# Patient Record
Sex: Female | Born: 1935 | Race: White | Hispanic: No | Marital: Married
Health system: Southern US, Community
[De-identification: ages and names within clinical notes are randomized; demographics above are authoritative.]

---

## 2004-07-28 ENCOUNTER — Inpatient Hospital Stay: Payer: Self-pay | Admitting: Cardiovascular Disease

## 2004-08-30 ENCOUNTER — Ambulatory Visit: Payer: Self-pay | Admitting: Internal Medicine

## 2004-09-18 ENCOUNTER — Ambulatory Visit: Payer: Self-pay | Admitting: Specialist

## 2004-09-19 ENCOUNTER — Ambulatory Visit: Payer: Self-pay | Admitting: Specialist

## 2004-11-15 ENCOUNTER — Ambulatory Visit: Payer: Self-pay | Admitting: Internal Medicine

## 2005-12-06 ENCOUNTER — Ambulatory Visit: Payer: Self-pay | Admitting: Internal Medicine

## 2006-01-21 ENCOUNTER — Ambulatory Visit: Payer: Self-pay | Admitting: Gastroenterology

## 2007-01-01 ENCOUNTER — Ambulatory Visit: Payer: Self-pay | Admitting: Internal Medicine

## 2007-02-12 ENCOUNTER — Ambulatory Visit: Payer: Self-pay

## 2007-07-27 ENCOUNTER — Ambulatory Visit: Payer: Self-pay | Admitting: Specialist

## 2007-07-30 ENCOUNTER — Inpatient Hospital Stay: Payer: Self-pay | Admitting: Specialist

## 2008-02-03 ENCOUNTER — Ambulatory Visit: Payer: Self-pay | Admitting: Internal Medicine

## 2009-02-22 ENCOUNTER — Ambulatory Visit: Payer: Self-pay | Admitting: Internal Medicine

## 2009-07-27 ENCOUNTER — Ambulatory Visit: Payer: Self-pay | Admitting: Internal Medicine

## 2010-05-18 ENCOUNTER — Ambulatory Visit: Payer: Self-pay | Admitting: Internal Medicine

## 2010-05-29 ENCOUNTER — Ambulatory Visit: Payer: Self-pay | Admitting: Internal Medicine

## 2010-06-13 ENCOUNTER — Ambulatory Visit: Payer: Self-pay | Admitting: Emergency Medicine

## 2010-06-22 ENCOUNTER — Ambulatory Visit: Payer: Self-pay | Admitting: Emergency Medicine

## 2010-06-28 ENCOUNTER — Ambulatory Visit: Payer: Self-pay | Admitting: Emergency Medicine

## 2010-06-29 ENCOUNTER — Ambulatory Visit: Payer: Self-pay | Admitting: Emergency Medicine

## 2010-07-05 LAB — PATHOLOGY REPORT

## 2010-07-10 ENCOUNTER — Ambulatory Visit: Payer: Self-pay | Admitting: Oncology

## 2010-07-22 ENCOUNTER — Ambulatory Visit: Payer: Self-pay | Admitting: Oncology

## 2010-07-31 ENCOUNTER — Ambulatory Visit: Payer: Self-pay | Admitting: Emergency Medicine

## 2010-08-22 ENCOUNTER — Ambulatory Visit: Payer: Self-pay | Admitting: Oncology

## 2010-09-20 ENCOUNTER — Ambulatory Visit: Payer: Self-pay | Admitting: Oncology

## 2010-11-12 ENCOUNTER — Ambulatory Visit: Payer: Self-pay | Admitting: Oncology

## 2010-11-13 LAB — CANCER ANTIGEN 27.29: CA 27.29: 22.3 U/mL (ref 0.0–38.6)

## 2010-11-20 ENCOUNTER — Ambulatory Visit: Payer: Self-pay | Admitting: Oncology

## 2010-12-21 ENCOUNTER — Ambulatory Visit: Payer: Self-pay | Admitting: Oncology

## 2010-12-31 ENCOUNTER — Inpatient Hospital Stay: Payer: Self-pay | Admitting: Vascular Surgery

## 2011-01-17 ENCOUNTER — Emergency Department: Payer: Self-pay | Admitting: Emergency Medicine

## 2011-01-20 ENCOUNTER — Ambulatory Visit: Payer: Self-pay | Admitting: Oncology

## 2011-02-13 LAB — CANCER ANTIGEN 27.29: CA 27.29: 16.4 U/mL (ref 0.0–38.6)

## 2011-02-20 ENCOUNTER — Ambulatory Visit: Payer: Self-pay | Admitting: Oncology

## 2011-03-20 ENCOUNTER — Ambulatory Visit: Payer: Self-pay | Admitting: Specialist

## 2011-03-23 ENCOUNTER — Ambulatory Visit: Payer: Self-pay | Admitting: Oncology

## 2011-04-02 ENCOUNTER — Ambulatory Visit: Payer: Self-pay | Admitting: Emergency Medicine

## 2011-04-03 LAB — PATHOLOGY REPORT

## 2011-05-30 ENCOUNTER — Ambulatory Visit: Payer: Self-pay | Admitting: Specialist

## 2011-07-01 IMAGING — US ULTRASOUND LEFT BREAST
1 series · 17 of 17 positions shown · non-contrast
Comparison: none

REASON FOR EXAM: LT NOD DENSITY
COMMENTS:

PROCEDURE:     US  - US BREAST LEFT  - May 29, 2010 [DATE]
RESULT:     An irregularly shadowing hypoechoic lesion is noted in the
region of mammographic abnormality in the outer aspect of the left breast.
This lesion is suspicious for malignancy and surgical evaluation is
suggested.

[Series 1: ultrasound left breast · 17 of 17 slices shown]
[im 1/17]
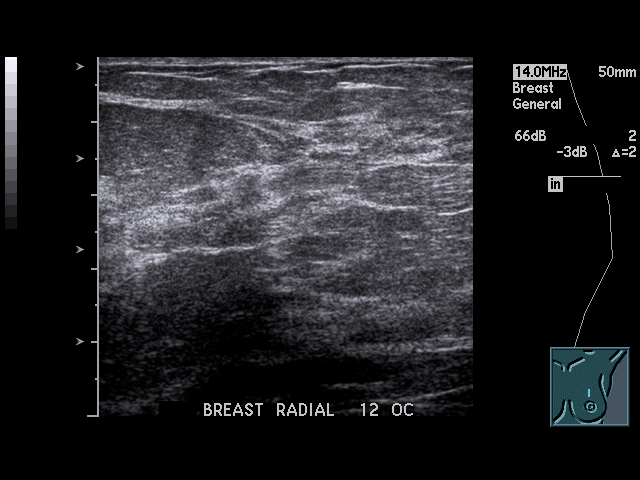
[im 2/17]
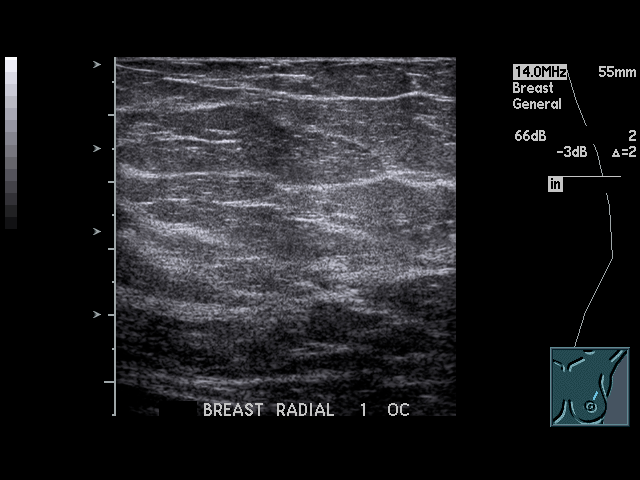
[im 3/17]
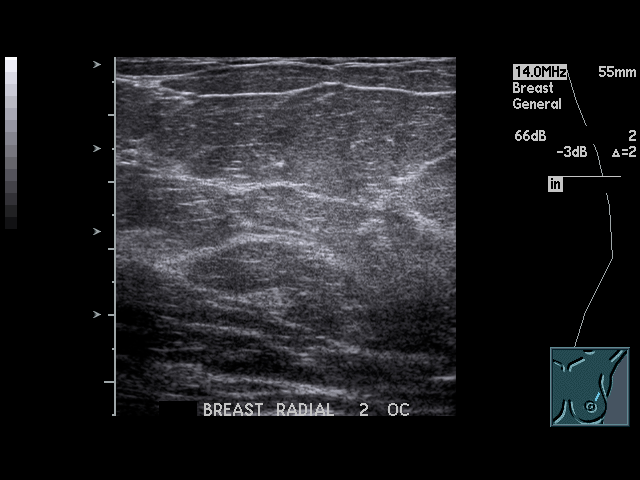
[im 4/17]
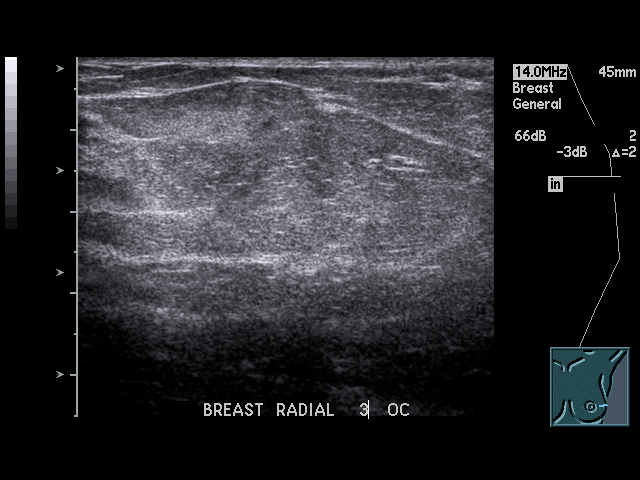
[im 5/17]
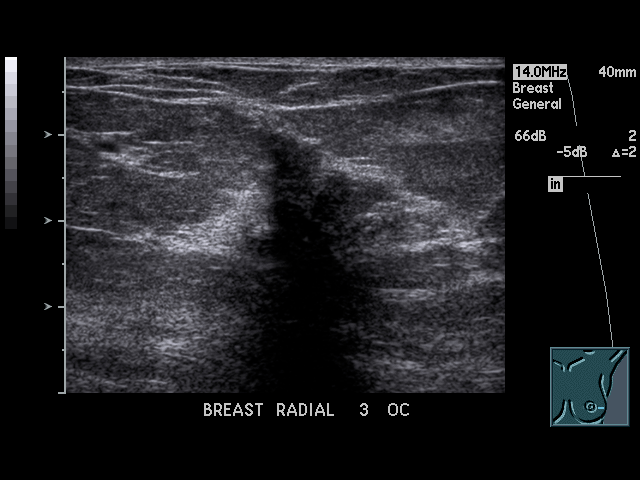
[im 6/17]
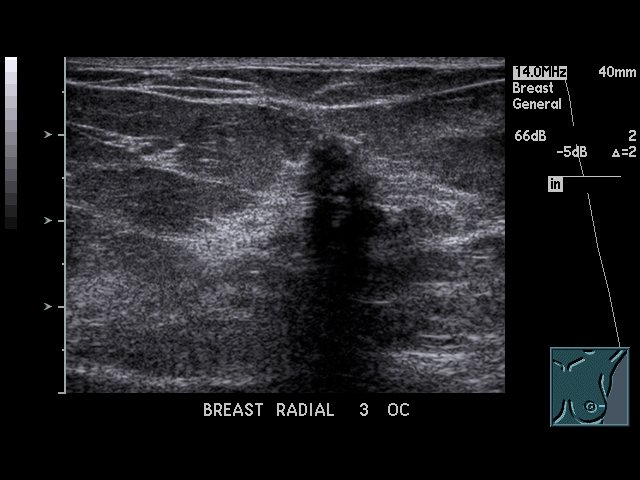
[im 7/17]
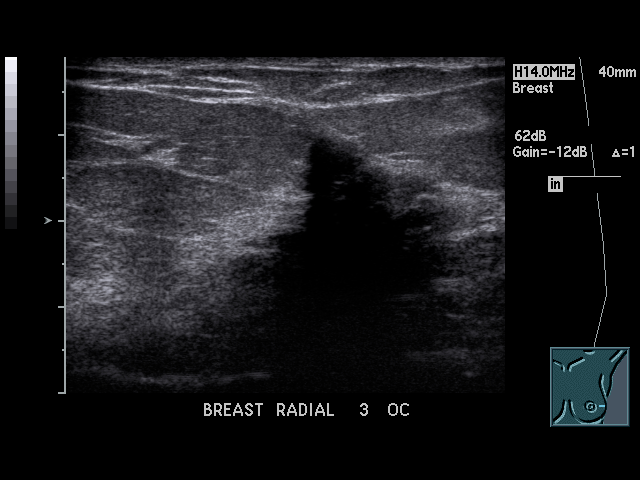
[im 8/17]
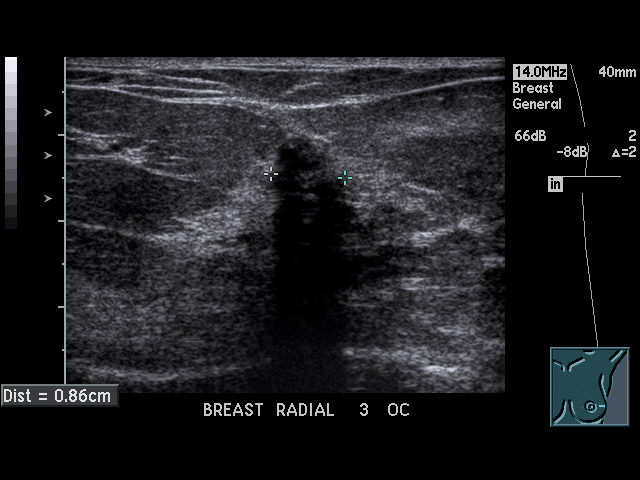
[im 9/17]
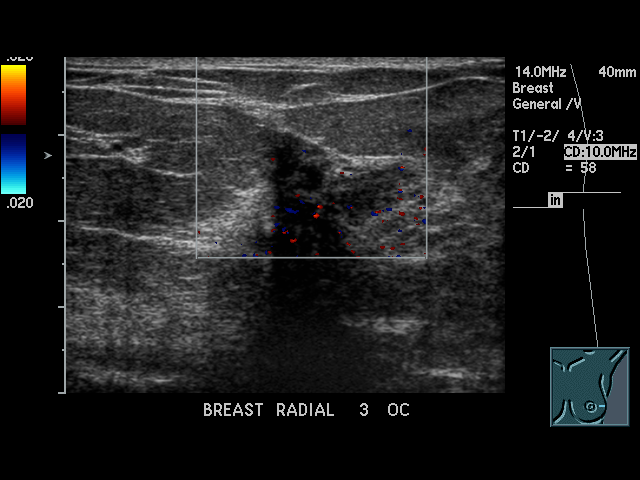
[im 10/17]
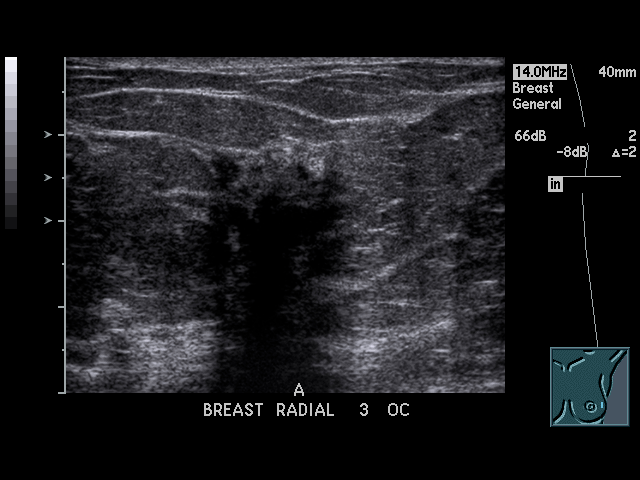
[im 11/17]
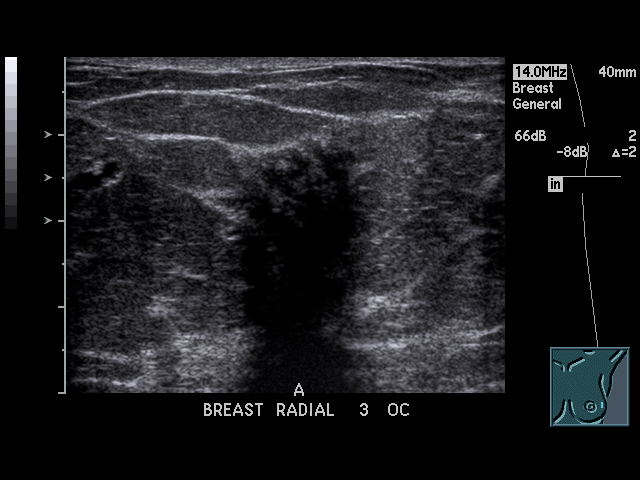
[im 12/17]
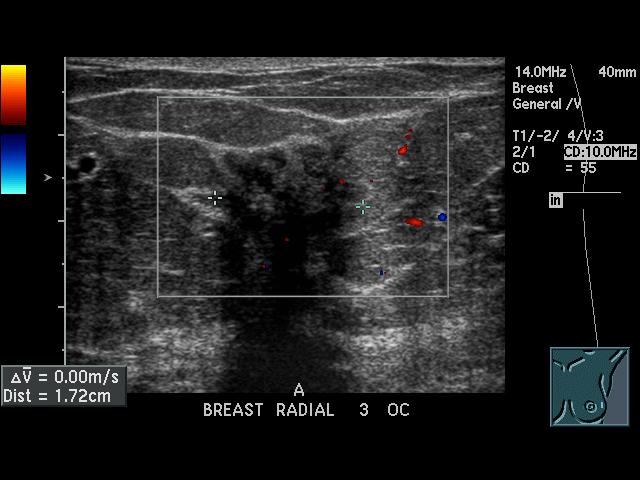
[im 13/17]
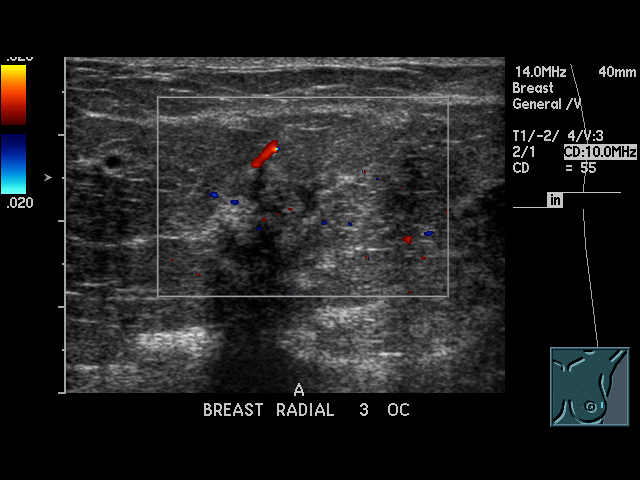
[im 14/17]
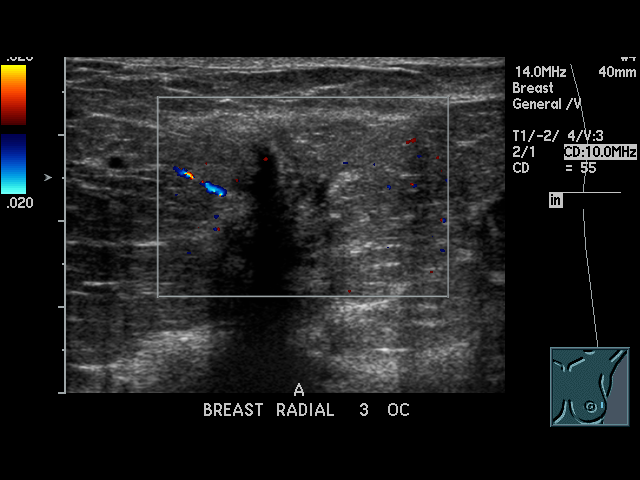
[im 15/17]
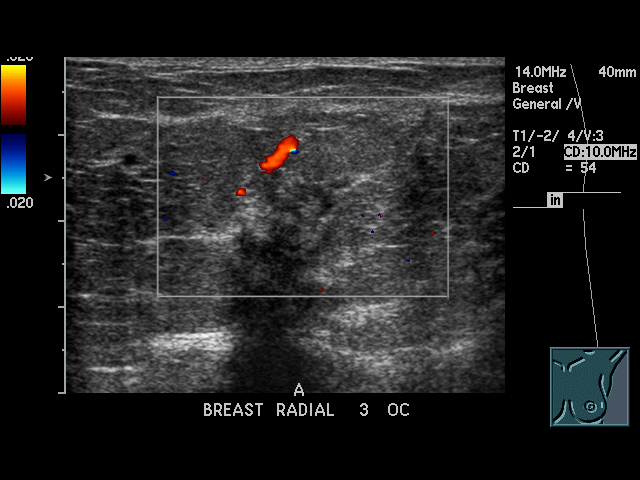
[im 16/17]
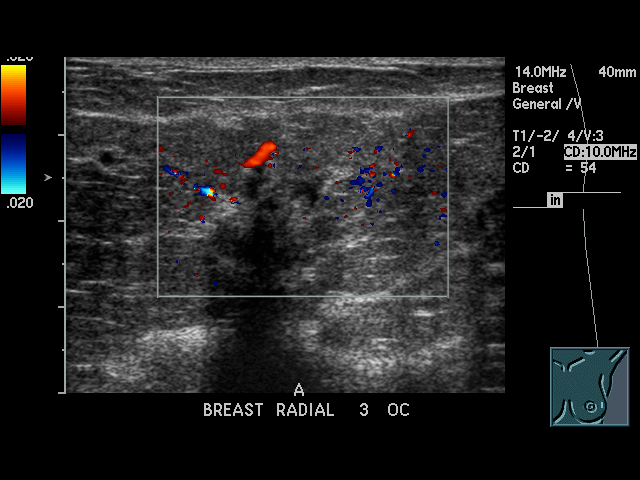
[im 17/17]
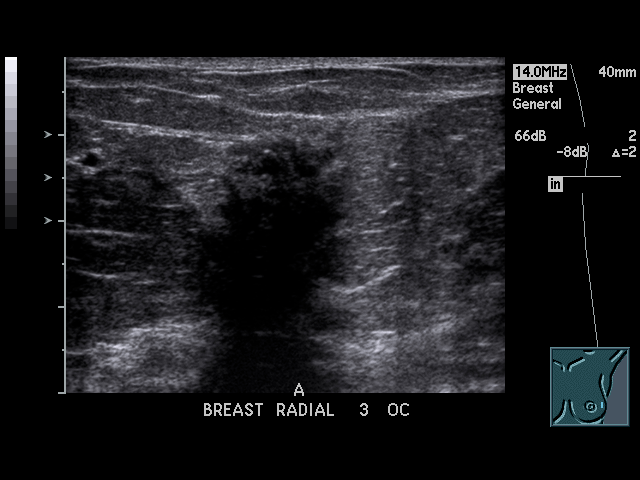

[17 of 17 positions shown; findings below may reference images not displayed]

IMPRESSION: BI-RADS:  Category 4- Suspicious Abnormality.

## 2011-07-31 IMAGING — CR DG CHEST 2V
1 series · 3 of 3 positions shown · non-contrast
Comparison: none

REASON FOR EXAM: breast ca
COMMENTS:

[Series 1: view not recorded · 0.17mm/px · 3 of 3 slices shown]
[im 1/3]
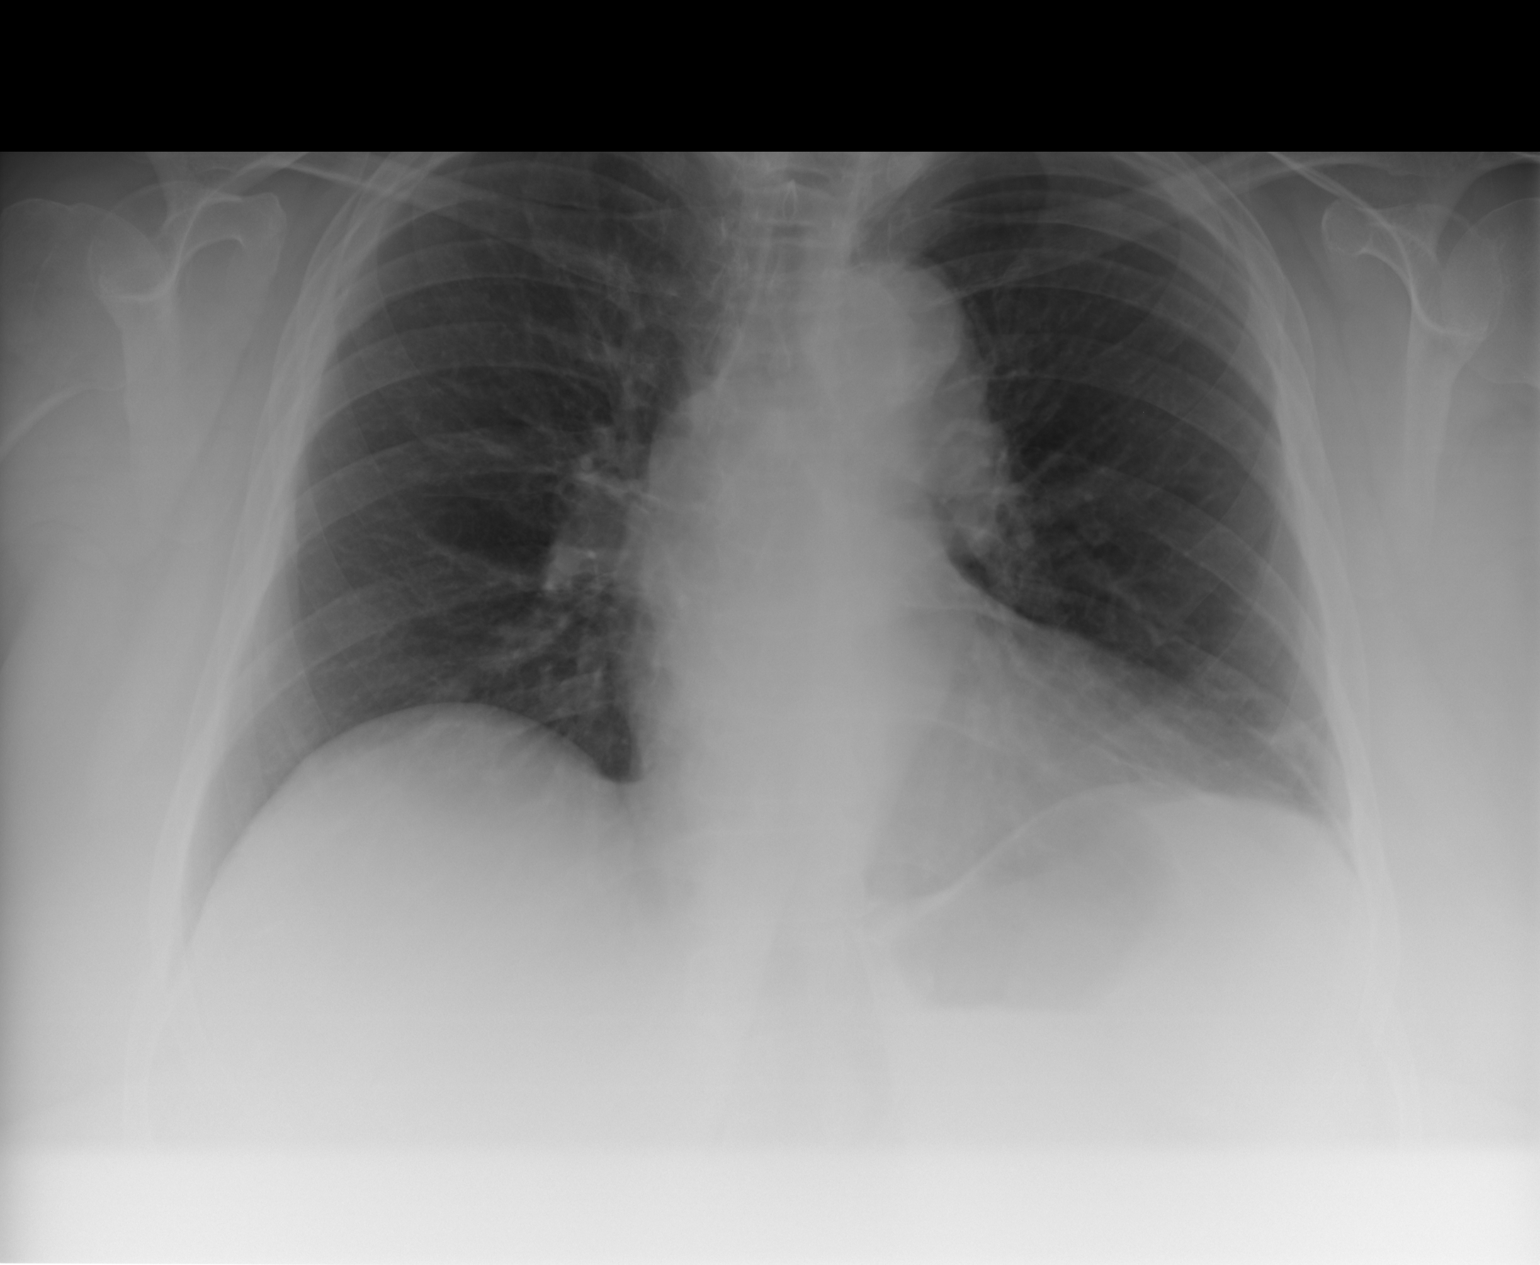
[im 2/3]
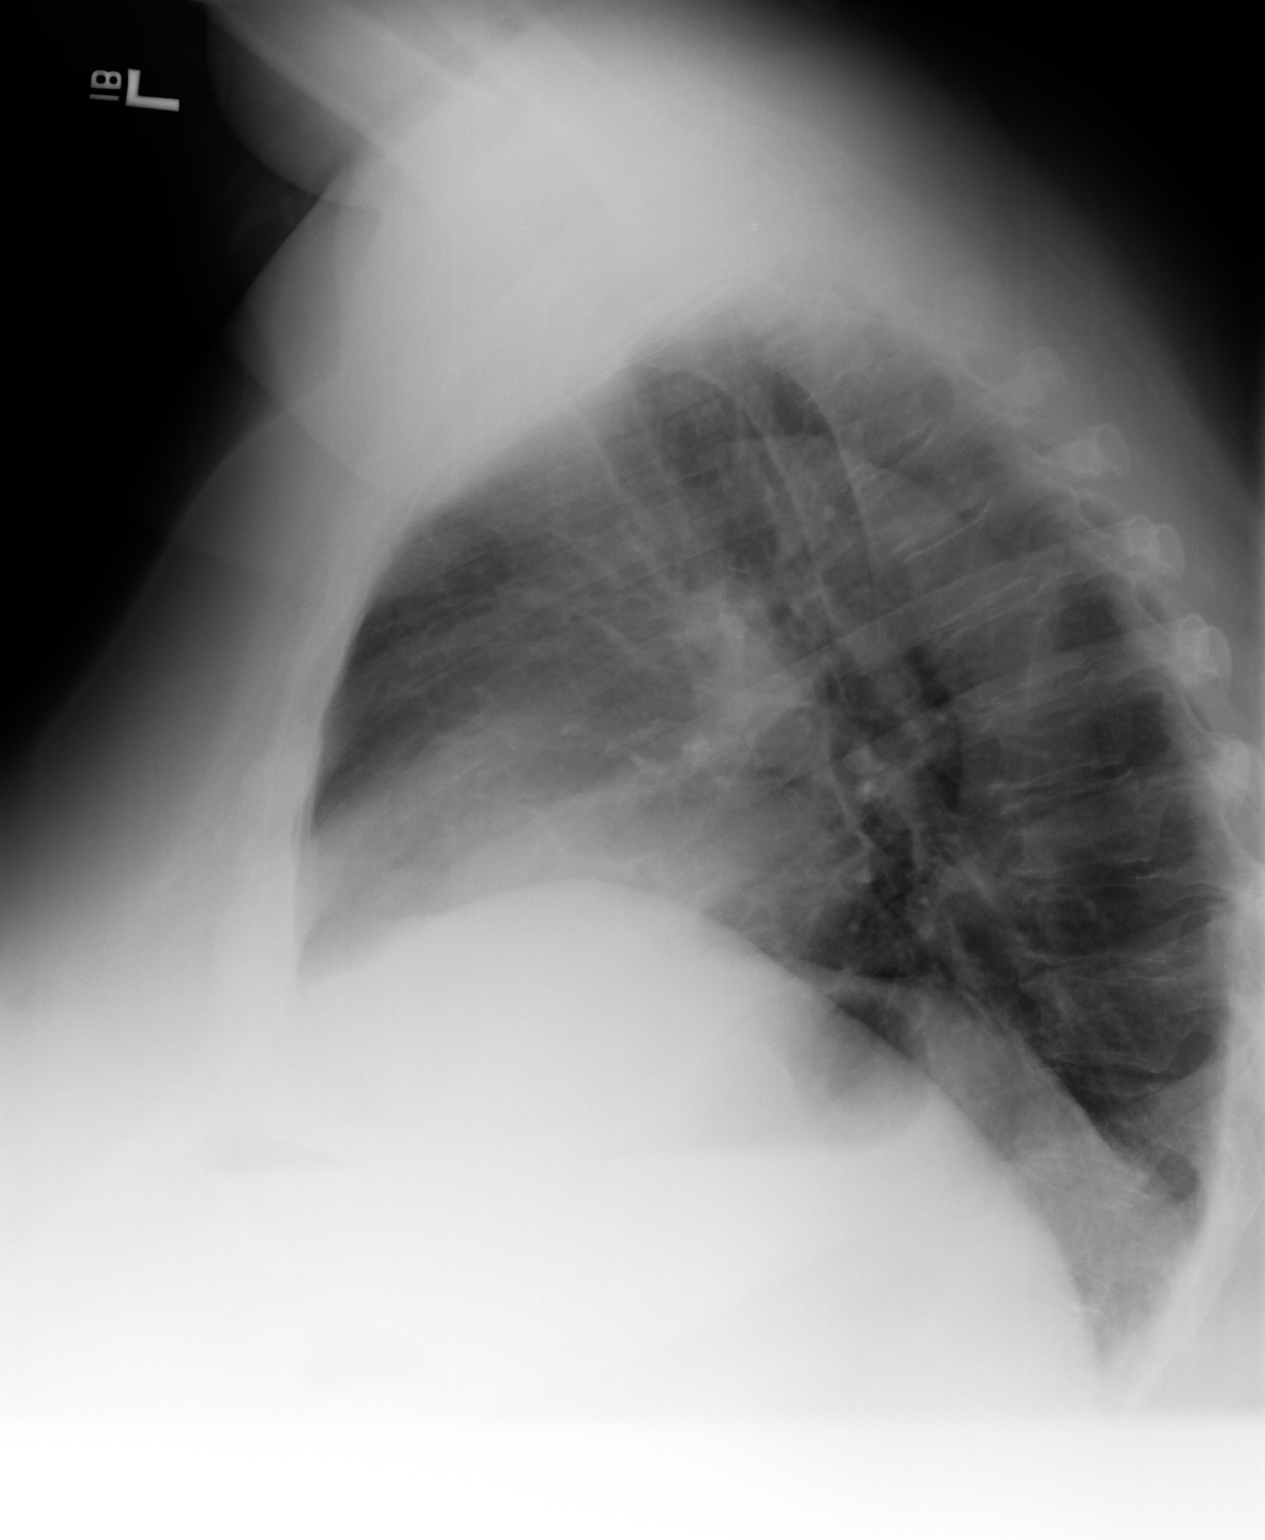
[im 3/3]
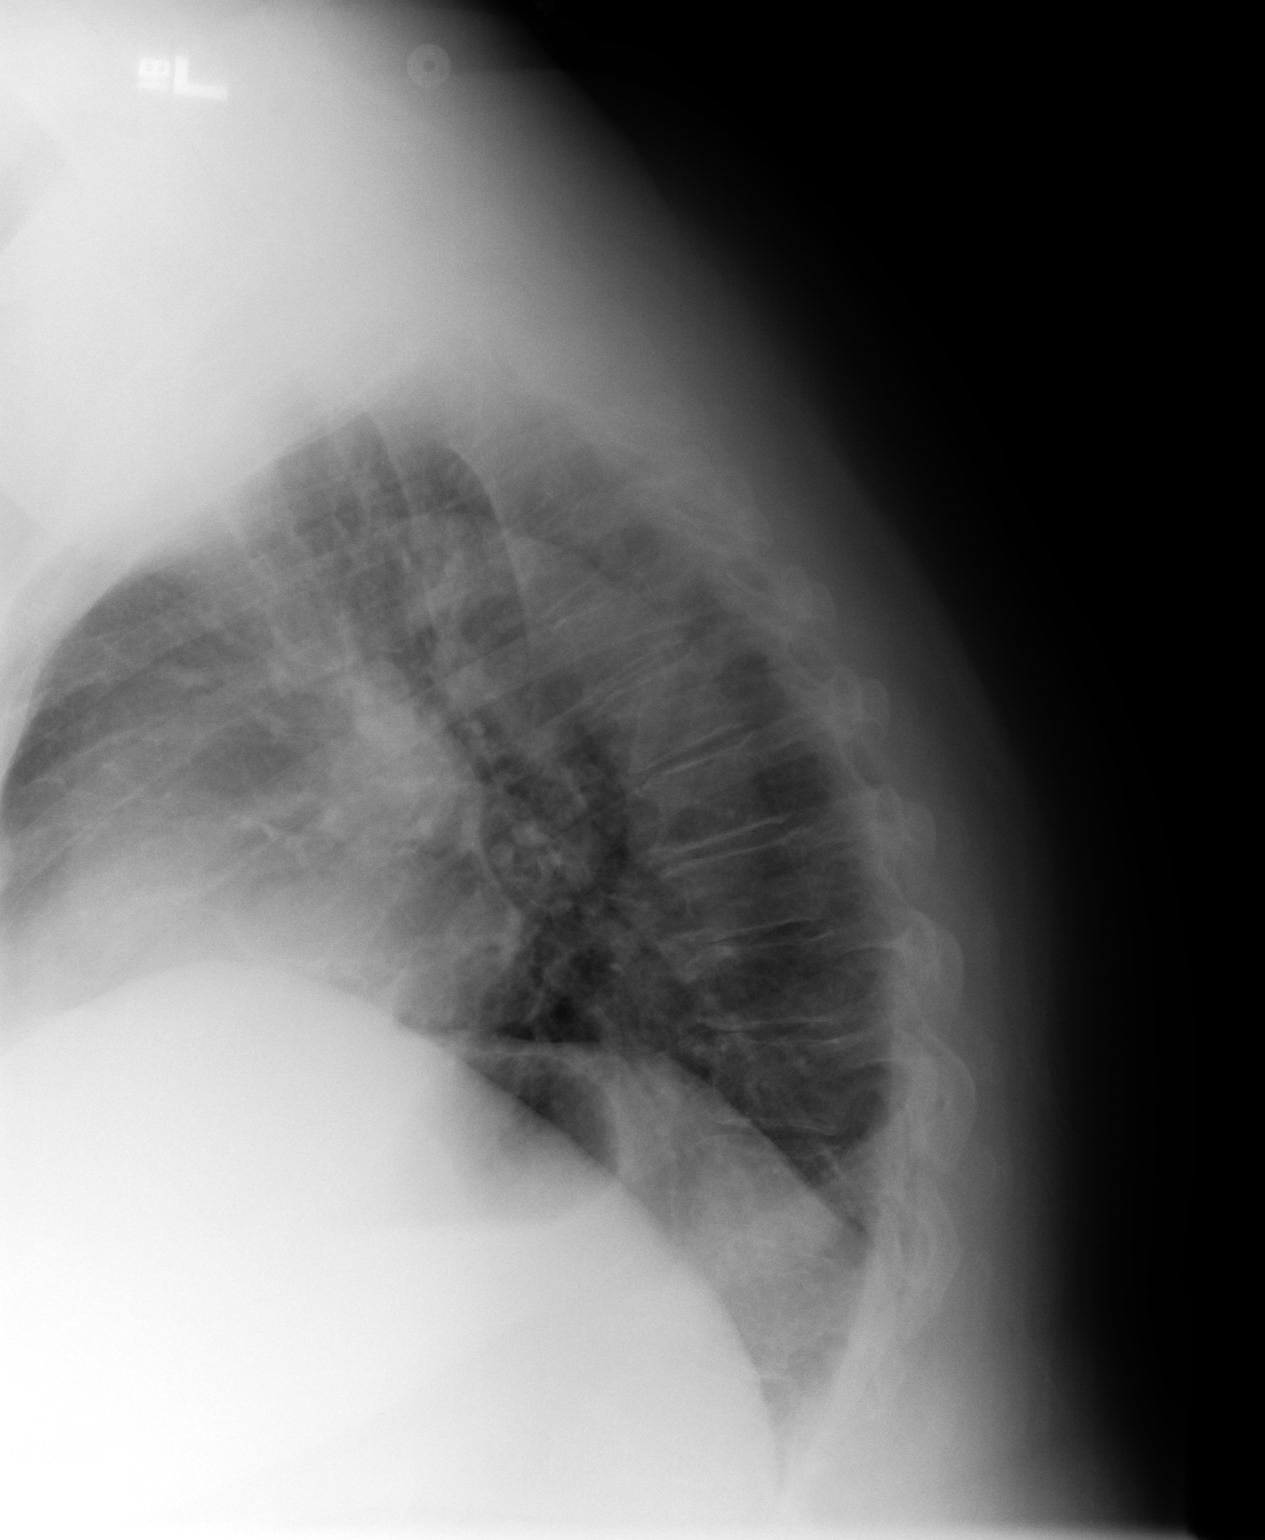

[3 of 3 positions shown; findings below may reference images not displayed]

PROCEDURE:     DXR - DXR CHEST PA (OR AP) AND LATERAL  - June 28, 2010  [DATE]

RESULT:     Comparison is made to the prior exam of 07/27/2009.

The lung fields are clear. No pneumonia, pneumothorax or pleural effusion is
seen. No findings suspicious for metastatic disease are identified. The
heart size is normal. No acute bony abnormalities are seen.
IMPRESSION: No acute changes are identified.

## 2011-08-01 IMAGING — NM NM SENTINAL NODE INJECTION (BREAST) - NO REPORT
1 series · 2 of 2 positions shown · non-contrast
Comparison: none

REASON FOR EXAM: lft breast lumpectomy SN poss axillary bx [DATE] surgery
COMMENTS:

PROCEDURE:     NM  - NM SENTINEL NODE  BREAST  - June 29, 2010  [DATE]
RESULT:     Comparison:  No comparison.
Radiopharmaceutical:  1.00 mCi Xc-55m unfiltered sulfur colloid in 2 ml
volume, injected subcutaneously into the periareolar tissues of the left
breast.
TECHNIQUE: Using sterile technique, the periareolar tissue was anesthetized
with 2 mL 1% lidocaine.  With a 27 gauge, 1/2 inch needle, the
radiopharmaceutical was injected into the subcutaneous periareolar tissues
of the left breast. Planar images were obtained in the anterior projection
with the use of a Ho-F4 transmission source. The patient's arm was abducted
at 90 degrees from the body for the anterior  images.

[Series 1000: sent node breast static · 2.40mm/px · 2 of 2 frames shown]
[frame 1/2]
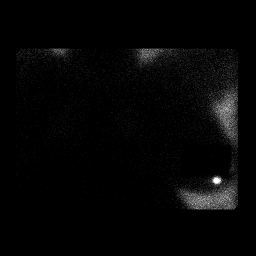
[frame 2/2]
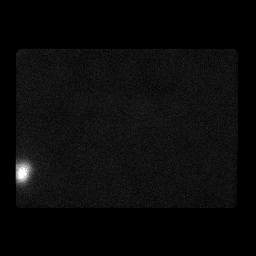

[2 of 2 positions shown; findings below may reference images not displayed]

FINDINGS: Images obtained at 5 minutes post injection demonstrate
localization of radiotracer in the region of the left lateral breast.
IMPRESSION: Successful injection of radiotracer into the left breast
periareolar soft tissues for interoperative localization.

## 2011-09-04 ENCOUNTER — Ambulatory Visit: Payer: Self-pay | Admitting: Oncology

## 2011-09-04 LAB — COMPREHENSIVE METABOLIC PANEL
Anion Gap: 6 — ABNORMAL LOW (ref 7–16)
BUN: 22 mg/dL — ABNORMAL HIGH (ref 7–18)
Bilirubin,Total: 0.4 mg/dL (ref 0.2–1.0)
Calcium, Total: 9.5 mg/dL (ref 8.5–10.1)
Chloride: 98 mmol/L (ref 98–107)
Creatinine: 1.11 mg/dL (ref 0.60–1.30)
Potassium: 4.3 mmol/L (ref 3.5–5.1)
SGOT(AST): 26 U/L (ref 15–37)
SGPT (ALT): 21 U/L
Total Protein: 7.3 g/dL (ref 6.4–8.2)

## 2011-09-04 LAB — CBC CANCER CENTER
Basophil #: 0 x10 3/mm (ref 0.0–0.1)
Basophil %: 0.2 %
Eosinophil #: 0.1 x10 3/mm (ref 0.0–0.7)
Eosinophil %: 3.2 %
HCT: 36.6 % (ref 35.0–47.0)
HGB: 11.8 g/dL — ABNORMAL LOW (ref 12.0–16.0)
Lymphocyte #: 1 x10 3/mm (ref 1.0–3.6)
Lymphocyte %: 21.3 %
MCH: 26.7 pg (ref 26.0–34.0)
MCHC: 32.2 g/dL (ref 32.0–36.0)
MCV: 83 fL (ref 80–100)
Neutrophil #: 3.1 x10 3/mm (ref 1.4–6.5)
RBC: 4.41 10*6/uL (ref 3.80–5.20)

## 2011-09-20 ENCOUNTER — Ambulatory Visit: Payer: Self-pay | Admitting: Oncology

## 2012-01-29 ENCOUNTER — Inpatient Hospital Stay: Payer: Self-pay | Admitting: Internal Medicine

## 2012-01-29 LAB — CBC WITH DIFFERENTIAL/PLATELET
Basophil #: 0 10*3/uL (ref 0.0–0.1)
Basophil %: 0.5 %
Eosinophil %: 2.3 %
HCT: 37.2 % (ref 35.0–47.0)
HGB: 11.8 g/dL — ABNORMAL LOW (ref 12.0–16.0)
Lymphocyte #: 1 10*3/uL (ref 1.0–3.6)
MCH: 26.8 pg (ref 26.0–34.0)
MCV: 85 fL (ref 80–100)
Monocyte %: 7.4 %
Neutrophil %: 71.6 %
Platelet: 146 10*3/uL — ABNORMAL LOW (ref 150–440)
RBC: 4.4 10*6/uL (ref 3.80–5.20)

## 2012-01-29 LAB — COMPREHENSIVE METABOLIC PANEL
Anion Gap: 6 — ABNORMAL LOW (ref 7–16)
Bilirubin,Total: 0.2 mg/dL (ref 0.2–1.0)
Calcium, Total: 9.3 mg/dL (ref 8.5–10.1)
Chloride: 98 mmol/L (ref 98–107)
Co2: 37 mmol/L — ABNORMAL HIGH (ref 21–32)
EGFR (African American): 46 — ABNORMAL LOW
EGFR (Non-African Amer.): 40 — ABNORMAL LOW
Potassium: 4.7 mmol/L (ref 3.5–5.1)
SGOT(AST): 23 U/L (ref 15–37)
SGPT (ALT): 23 U/L
Total Protein: 6.2 g/dL — ABNORMAL LOW (ref 6.4–8.2)

## 2012-01-29 LAB — CK TOTAL AND CKMB (NOT AT ARMC): CK, Total: 70 U/L (ref 21–215)

## 2012-01-29 LAB — TROPONIN I: Troponin-I: 0.04 ng/mL

## 2012-01-30 LAB — CBC WITH DIFFERENTIAL/PLATELET
Basophil %: 0.1 %
Eosinophil #: 0 10*3/uL (ref 0.0–0.7)
HCT: 37.9 % (ref 35.0–47.0)
HGB: 12.1 g/dL (ref 12.0–16.0)
Lymphocyte #: 0.5 10*3/uL — ABNORMAL LOW (ref 1.0–3.6)
Lymphocyte %: 11.2 %
MCH: 26.6 pg (ref 26.0–34.0)
MCHC: 32.1 g/dL (ref 32.0–36.0)
MCV: 83 fL (ref 80–100)
Monocyte #: 0 x10 3/mm — ABNORMAL LOW (ref 0.2–0.9)
Neutrophil #: 4.1 10*3/uL (ref 1.4–6.5)
Neutrophil %: 87.6 %

## 2012-01-30 LAB — BASIC METABOLIC PANEL
Anion Gap: 6 — ABNORMAL LOW (ref 7–16)
Calcium, Total: 9.7 mg/dL (ref 8.5–10.1)
EGFR (Non-African Amer.): 58 — ABNORMAL LOW
Glucose: 150 mg/dL — ABNORMAL HIGH (ref 65–99)
Potassium: 5 mmol/L (ref 3.5–5.1)
Sodium: 143 mmol/L (ref 136–145)

## 2012-02-26 ENCOUNTER — Ambulatory Visit: Payer: Self-pay | Admitting: Oncology

## 2012-03-04 ENCOUNTER — Ambulatory Visit: Payer: Self-pay | Admitting: Oncology

## 2012-03-04 LAB — CBC CANCER CENTER
Basophil %: 0.4 %
Eosinophil %: 2.4 %
HCT: 38.2 % (ref 35.0–47.0)
HGB: 12 g/dL (ref 12.0–16.0)
MCH: 26.2 pg (ref 26.0–34.0)
MCHC: 31.5 g/dL — ABNORMAL LOW (ref 32.0–36.0)
MCV: 83 fL (ref 80–100)
Monocyte #: 0.3 x10 3/mm (ref 0.2–0.9)
RBC: 4.6 10*6/uL (ref 3.80–5.20)
RDW: 14.9 % — ABNORMAL HIGH (ref 11.5–14.5)

## 2012-03-04 LAB — COMPREHENSIVE METABOLIC PANEL
Anion Gap: 1 — ABNORMAL LOW (ref 7–16)
BUN: 17 mg/dL (ref 7–18)
Bilirubin,Total: 0.3 mg/dL (ref 0.2–1.0)
Calcium, Total: 9.5 mg/dL (ref 8.5–10.1)
Chloride: 102 mmol/L (ref 98–107)
Co2: 41 mmol/L (ref 21–32)
Creatinine: 0.96 mg/dL (ref 0.60–1.30)
EGFR (African American): >60
EGFR (Non-African Amer.): 58 — ABNORMAL LOW
Glucose: 110 mg/dL — ABNORMAL HIGH (ref 65–99)
Total Protein: 7.3 g/dL (ref 6.4–8.2)

## 2012-03-22 ENCOUNTER — Ambulatory Visit: Payer: Self-pay | Admitting: Oncology

## 2012-04-02 ENCOUNTER — Ambulatory Visit: Payer: Self-pay | Admitting: Pain Medicine

## 2012-04-13 ENCOUNTER — Ambulatory Visit: Payer: Self-pay | Admitting: Pain Medicine

## 2012-05-07 ENCOUNTER — Ambulatory Visit: Payer: Self-pay | Admitting: Pain Medicine

## 2012-05-18 ENCOUNTER — Ambulatory Visit: Payer: Self-pay | Admitting: Pain Medicine

## 2012-06-11 ENCOUNTER — Ambulatory Visit: Payer: Self-pay | Admitting: Pain Medicine

## 2012-06-24 ENCOUNTER — Ambulatory Visit: Payer: Self-pay | Admitting: Pain Medicine

## 2012-07-13 ENCOUNTER — Ambulatory Visit: Payer: Self-pay | Admitting: Pain Medicine

## 2012-08-10 ENCOUNTER — Ambulatory Visit: Payer: Self-pay | Admitting: Pain Medicine

## 2012-09-10 ENCOUNTER — Ambulatory Visit: Payer: Self-pay | Admitting: Pain Medicine

## 2012-09-29 ENCOUNTER — Ambulatory Visit: Payer: Self-pay | Admitting: Oncology

## 2012-09-30 LAB — CBC CANCER CENTER
Basophil #: 0 x10 3/mm (ref 0.0–0.1)
Basophil %: 0.3 %
Eosinophil %: 3.1 %
HGB: 11.8 g/dL — ABNORMAL LOW (ref 12.0–16.0)
Lymphocyte #: 0.8 x10 3/mm — ABNORMAL LOW (ref 1.0–3.6)
MCHC: 32.1 g/dL (ref 32.0–36.0)
MCV: 83 fL (ref 80–100)
Monocyte #: 0.4 x10 3/mm (ref 0.2–0.9)
Neutrophil #: 5.4 x10 3/mm (ref 1.4–6.5)
Neutrophil %: 78.9 %
RDW: 15.9 % — ABNORMAL HIGH (ref 11.5–14.5)
WBC: 6.8 x10 3/mm (ref 3.6–11.0)

## 2012-09-30 LAB — COMPREHENSIVE METABOLIC PANEL
Albumin: 3.2 g/dL — ABNORMAL LOW (ref 3.4–5.0)
Alkaline Phosphatase: 112 U/L (ref 50–136)
BUN: 21 mg/dL — ABNORMAL HIGH (ref 7–18)
Creatinine: 0.83 mg/dL (ref 0.60–1.30)
EGFR (African American): >60
EGFR (Non-African Amer.): >60
Glucose: 101 mg/dL — ABNORMAL HIGH (ref 65–99)
SGOT(AST): 22 U/L (ref 15–37)
SGPT (ALT): 22 U/L (ref 12–78)
Total Protein: 7.3 g/dL (ref 6.4–8.2)

## 2012-10-01 LAB — CANCER ANTIGEN 27.29: CA 27.29: 30.3 U/mL (ref 0.0–38.6)

## 2012-10-20 ENCOUNTER — Ambulatory Visit: Payer: Self-pay | Admitting: Oncology

## 2012-12-03 ENCOUNTER — Inpatient Hospital Stay: Payer: Self-pay | Admitting: Internal Medicine

## 2012-12-03 LAB — CBC WITH DIFFERENTIAL/PLATELET
Basophil #: 0 10*3/uL (ref 0.0–0.1)
Basophil %: 0.7 %
Eosinophil #: 0.1 10*3/uL (ref 0.0–0.7)
Eosinophil %: 1.2 %
HCT: 36 % (ref 35.0–47.0)
HGB: 11.2 g/dL — ABNORMAL LOW (ref 12.0–16.0)
Lymphocyte %: 10.8 %
MCHC: 31.1 g/dL — ABNORMAL LOW (ref 32.0–36.0)
Monocyte %: 5.7 %
Platelet: 164 10*3/uL (ref 150–440)
RBC: 4.35 10*6/uL (ref 3.80–5.20)
WBC: 6.4 10*3/uL (ref 3.6–11.0)

## 2012-12-03 LAB — URINALYSIS, COMPLETE
Bilirubin,UR: NEGATIVE
Nitrite: POSITIVE
Ph: 6 (ref 4.5–8.0)
RBC,UR: 37 /HPF (ref 0–5)
WBC UR: 59 /HPF (ref 0–5)

## 2012-12-03 LAB — TROPONIN I: Troponin-I: 0.32 ng/mL — ABNORMAL HIGH

## 2012-12-03 LAB — COMPREHENSIVE METABOLIC PANEL
Albumin: 3.2 g/dL — ABNORMAL LOW (ref 3.4–5.0)
Alkaline Phosphatase: 105 U/L (ref 50–136)
Calcium, Total: 9.6 mg/dL (ref 8.5–10.1)
Chloride: 96 mmol/L — ABNORMAL LOW (ref 98–107)
Co2: 43 mmol/L (ref 21–32)
Creatinine: 0.79 mg/dL (ref 0.60–1.30)
Osmolality: 290 (ref 275–301)
SGOT(AST): 25 U/L (ref 15–37)
Sodium: 138 mmol/L (ref 136–145)

## 2012-12-03 LAB — CK TOTAL AND CKMB (NOT AT ARMC): CK-MB: 1.3 ng/mL (ref 0.5–3.6)

## 2012-12-20 DEATH — deceased

## 2014-11-11 NOTE — H&P (Signed)
PATIENT NAME:  Pepper, Raguel M MR#:  [id number] DATE OF BIRTH:  03/Daneen Schick19/1937  DATE OF ADMISSION:  12/03/2012  PRIMARY CARE PHYSICIAN: Marletta LorJulie Barr, NP with phone number [phone]  CHIEF COMPLAINT: Drowsiness, confusion and worsening shortness of breath.   HISTORY OF PRESENTING ILLNESS: A 79 year old female patient with history of COPD, chronic respiratory failure on 3 liters oxygen with worsening and history of breast cancer who was supposed to see Hospice, became extremely drowsy and altered, was brought to the Emergency Room by family. The patient is DNR/DNI. ABG showed her pCO2 to be extremely high at 104, and she is being admitted to the hospitalist service.   The patient tends to get severely short of breath on just activities of daily living. She follows with Dr. Meredeth IdeFleming, of pulmonology, is supposed to follow with Hospice from tomorrow. Presently, the patient is still drowsy but wakes up on calling her name and answers a few questions.   The patient is established with a new nurse practitioner, Marletta LorJulie Barr, for a month. She was decreased on her torsemide dose along with decrease in blood pressure medications as she had hypotension recently.   PAST MEDICAL HISTORY: 1.  Chronic obstructive pulmonary disease.  2.  Chronic respiratory failure on 2 liters oxygen.  3.  Congestive heart failure, unknown diastolic or systolic.  4.  History of DVT, status post IVC filter placement as she had a GI bleed.  5.  Hypertension.  6.  Hyperlipidemia.  7.  Obesity.  8.  Sleep apnea, on a CPAP.   CODE STATUS: DNR/DNI.   REVIEW OF SYSTEMS: Unobtainable secondary to acute encephalopathy.   ALLERGIES: No known drug allergies.   SOCIAL HISTORY: The patient used to smoke in the past but quit many years back. No alcohol. No illicit drugs. She lives with her husband.  The husband has to help her with all of her activities.   FAMILY HISTORY: Mother died from breast cancer. Father had a history of heavy  alcohol abuse, died from COPD and cirrhosis.  This history obtained from old records.   ALLERGIES: AMOXICILLIN, COUMADIN, VICODIN.   HOME MEDICATIONS: 1.  Acetaminophen/oxycodone 325/5, 1 tablet oral 2 to 3 times a day.  2.  Calcium with vitamin D 1 tablet oral once a day.  3.  Donepezil 10 mg oral once a day.  4.  Femara 2.5 mg oral once a day.  5.  Lexapro 20 mg oral once a day.  6.  Lyrica 150 mg oral 2 times a day.  7.  Namenda 10 mg oral 2 times a day.  8.  Potassium chloride 20 mEq oral once a day.  9.  Simvastatin 20 mg oral once a day.  10.  Torsemide 20 mg oral on Monday, Wednesday and Friday.   PHYSICAL EXAMINATION: VITAL SIGNS: Temperature 97.9, pulse of 58, respirations 84, blood pressure 119/80, saturating 95% on a BiPAP.  GENERAL: Morbidly obese Caucasian female patient lying in bed in significant respiratory distress and drowsy.  PSYCHIATRIC: Unassessable, but the patient is drowsy.  HEENT: Atraumatic, normocephalic. Oral mucosa dry and pink. External ears and nose normal. Pallor positive. No icterus.  Pupils bilaterally equal and reactive to light.  NECK: Supple. No thyromegaly. No palpable lymph nodes. Trachea midline. No carotid bruit, JVD.  CARDIOVASCULAR: S1, S2 without any murmurs, has 1+ edema in the lower extremities.  RESPIRATORY: Has decreased breath sounds on both sides with a quiet chest. BiPAP in place, using accessory muscles.  GASTROINTESTINAL: Soft abdomen,  nontender. Bowel sounds present. No hepatosplenomegaly palpable, extremely distended.  SKIN: No petechiae, rash, ulcers.  MUSCULOSKELETAL: No joint swelling, redness, effusion of the large joints. Normal muscle tone.   LABORATORY AND RADIOLOGICAL DATA:  Glucose of 111, BUN 22, creatinine 0.79, GFR greater than 60. Ammonia elevated at 44. Albumin 3.2. CPK of 1.3. WBC 6.4, hemoglobin 11.2, platelets of 164.  ABG shows pH of 7.28 with pCO2 of 104, pO2 43.  EKG shows normal sinus rhythm with LVH.  Chest  x-ray shows atelectasis.   ASSESSMENT AND PLAN: 1.  Acute on chronic respiratory failure secondary to acute chronic obstructive pulmonary disease exacerbation:  The patient has respiratory cirrhosis with severely elevated pCO2 with encephalopathy secondary to it. We will start the patient on BiPAP.   The patient is DNR/DNI, critically ill.  I discussed with the family regarding the critical nature of illness. They understand her sickness.  We will have Palliative Care see the patient.  The patient will be on scheduled nebulizers, IV steroids and antibiotics.  I suspect the patient also has component of congestive heart failure. We will start her on IV Lasix, monitor I's and O's.  Unknown if systolic or diastolic.  I will not get a 2-D echocardiogram considering patient will be on hospice and has terminal care and would not change management.  2.  Uncontrolled hypertension:  The patient's blood pressure medications were recently stopped secondary to hypotension. We will use IV p.r.n. medications at this time.  3.  Acute encephalopathy: Secondary to narcosis.  4.  History of deep vein thrombosis:  Status post IVC filter due to GI bleed. Heparin subq for DVT prophylaxis.  5.  Breast cancer:  The patient is under surveillance with [facility].  6.  Elevated ammonia:  The patient will need lactulose if she does not improve in spite of being on BiPAP.   CODE STATUS: DNR/DNI discussed with the family.         TIME SPENT: Time spent today on this critically ill case on BiPAP with respiratory acidosis, elevated pCO2 was 55 minutes.  ____________________________ Molinda Bailiff Helene Bernstein, MD srs:cb D: 12/03/2012 22:30:32 ET T: 12/03/2012 22:41:29 ET JOB#: [id number]  cc: Wardell Heath R. Kolbee Bogusz, MD, <Dictator> Janak K. Choksi, MD Herbon E. Meredeth Ide, MD Marletta Lor, NP Orie Fisherman MD ELECTRONICALLY SIGNED 12/21/2012 23:26

## 2014-11-11 NOTE — Discharge Summary (Signed)
PATIENT NAME:  Veronica Mora, Veronica Mora MR#:  [id number] DATE OF BIRTH:  [birth year]  DATE OF ADMISSION:  12/03/2012  DATE OF DISCHARGE:  12/04/2012  ADMITTING DIAGNOSES:  Decrease in responsiveness, confusion, shortness of breath.   DISCHARGE DIAGNOSES: 1. Acute on chronic respiratory failure due to chronic obstructive pulmonary disease exacerbation and possible congestive heart failure, unknown type.  2.  Acute encephalopathy due to hypercarbia, now resolved.  3.  Possible acute systolic congestive heart failure.  4.  Morbid obesity.  5.  Chronic respiratory failure, on 2 to 3 liters of oxygen.  6.  History of deep venous thrombosis, status post inferior vena cava filter placement.  7.  Hypertension.  8.  Hyperlipidemia.  9.  Obesity.  10.  Sleep apnea, on CPAP.  11.  History of breast cancer.  12.  Urinary tract infection.  CONSULTANTS:  Hospice.   PERTINENT LABS AND EVALUATIONS: Admitting glucose 111, BUN 22, creatinine 0.79, sodium 138, potassium 4.9, chloride 96, CO2 was 43, calcium 9.6, ammonia level 44. LFTs showed albumin of 3.2. Troponin was 0.32. WBC 6.4, hemoglobin 11.2, platelet count 164. Urinalysis showed leukocytes 2+, bacteria 3+. ABG:  pH of 7.28, pCO2 of 104, pO2 of 43.   Chest x-ray showed possible mild interstitial edema, COPD changes.   HOSPITAL COURSE:  Please refer to H and P done by the admitting physician. The patient is a 79 year old female with history of COPD, chronic respiratory failure, history of breast cancer, who has progressive worsening of her respiratory status, and has been arranged to be seen by home hospice. That was for today; however, patient yesterday became very sleepy and drowsy and progressively short of breath, so was brought to the ED. She was noted to have hypocarbic respiratory failure and had to be placed on BiPAP. Was started on nebulizers. She was also noted to have a urinary tract infection, which was treated. The patient's breathing has  significantly improved. Mental status is improved. She is currently on 2 liters. When I went to see her, her whole family, including her daughters, were at bedside, and she wanted to go home with home hospice, so at this time, arrangements are made for her to be discharged with home hospice.   DISCHARGE MEDICATIONS: Simvastatin 20 daily, Femara 2.5 p.o. daily, Namenda 10, 1 tab p.o. b.i.d., calcium plus vitamin D 1 tab p.o. daily, Lyrica 150, 1 tab p.o. b.i.d., torsemide 20 Monday, Wednesday, Friday, potassium 20 mEq daily, acetaminophen/oxycodone 325/5, 1 tab 2 to 3 times a day, 10 daily, Lexapro 20 daily, prednisone taper 60 mg, start 10 mg, decrease by 10 mg until complete, Levaquin 500 p.o. q. 24 x 5 days. Home O2 to be continued at 2 to 3 liters as previously.   DIET:  Low sodium.   ACTIVITY:  As tolerated.   Home hospice has been referred.  Time for followup:  1 to 2 weeks with primary MD   The patient's code status is DNR.    NOTE:   35 minutes spent on the discharge.   ____________________________ Lacie ScottsShreyang H. Allena KatzPatel, MD shp:mr D: 12/04/2012 14:06:08 ET T: 12/04/2012 21:31:01 ET JOB#: [id number]  cc: Jess Sulak H. Allena KatzPatel, MD, <Dictator> Charise CarwinSHREYANG H Dawnelle Warman MD ELECTRONICALLY SIGNED 12/08/2012 12:45

## 2014-11-13 NOTE — H&P (Signed)
PATIENT NAME:  Daneen SchickFOOTE, Jeff M MR#:  [id number] DATE OF BIRTH:  12-22-1935  DATE OF ADMISSION:  01/29/2012  PRIMARY CARE PHYSICIAN: Yves DillNeelam Khan, MD   PULMONOLOGIST: Dr. Meredeth IdeFleming    CHIEF COMPLAINT: Altered mental status.   HISTORY OF PRESENT ILLNESS: This is a 79 year old female who presents to the Emergency Room due to altered mental status and lethargy. The patient's history is mostly obtained from the husband at bedside. As per the husband, the patient has been more somnolent and lethargic since this past Monday. The patient about a year or so ago had similar symptoms, was noted to have increasing levels of CO2 and CO2 retention from underlying sleep apnea. They were a bit concerned that this was going on again, therefore, they brought her to the ER. The patient did have an ABG done which showed a pCO2 of 96 with some mild respiratory acidosis. Hospitalist services were then contacted for further treatment and evaluation.   The patient presently denies any chest pain. Denies any worsening shortness of breath. Denies any nausea, vomiting, cough, or any other associated symptoms presently.   REVIEW OF SYSTEMS: CONSTITUTIONAL: No documented fever. No weight gain. No weight loss. EYES: No blurry or double vision. ENT: No tinnitus. No postnasal drip. No redness of the oropharynx. RESPIRATORY: No cough, no wheeze, no hemoptysis. Positive dyspnea, which is chronic. CARDIOVASCULAR: No chest pain, no orthopnea, no palpitations, no syncope. GI: No nausea, no vomiting, no diarrhea, no abdominal pain, no melena, no hematochezia. GU: No dysuria, no hematuria. ENDOCRINE: No polyuria or nocturia. No heat or cold intolerance. HEME: No anemia, no bruising, no bleeding. INTEGUMENTARY: No rashes, no lesions. MUSCULOSKELETAL: No arthritis, no swelling, no gout. NEUROLOGIC: No numbness, no tingling, no ataxia, no seizure-type activity. PSYCH: No anxiety, no insomnia, no ADD.   PAST MEDICAL HISTORY:  1. Chronic  respiratory failure secondary to obstructive sleep apnea.  2. History of breast cancer.  3. Hypertension.  4. Hyperlipidemia.  5. History of congestive heart failure.  6. Depression.   ALLERGIES: No known drug allergies.   SOCIAL HISTORY: The patient used to smoke, quit many years ago. No alcohol abuse. No illicit drug abuse. Lives with her husband.   FAMILY HISTORY: Mother died from breast cancer. Father had history of heavy alcohol abuse and died from COPD and cirrhosis.   CURRENT MEDICATIONS:  1. Tylenol with oxycodone 5/325 1 tab t.i.d. as needed.  2. Benicar/HCTZ 20/12.5 1 tab daily.  3. Femara 2.5 mg daily.  4. Colace 100 mg daily.  5. Lyrica 150 mg b.i.d.  6. Namenda 10 mg b.i.d.  7. Potassium 20 mEq daily as needed with torsemide.  8. Simvastatin 20 mg daily.  9. Torsemide 20 mg daily as needed.  10. Venlafaxine 75 mg 1 tab b.i.d.   PHYSICAL EXAMINATION ON ADMISSION:   VITAL SIGNS: Temperature afebrile, pulse 56, respirations 28, blood pressure 120/66, sats 99% on BiPAP.   GENERAL: She is a pleasant but lethargic appearing female in bed in no apparent distress.   HEENT: Atraumatic, normocephalic. Her extraocular muscles are intact. Pupils equal and reactive to light. Sclerae anicteric. No conjunctival injection. No pharyngeal erythema.   NECK: Supple. No jugular venous distention, no bruits, no lymphadenopathy, no thyromegaly.   HEART: Regular rate and rhythm. No murmurs, no rubs, no clicks.   LUNGS: Clear to auscultation bilaterally. No rales, no rhonchi, no wheezes. No dullness to percussion.   ABDOMEN: Soft, flat, nontender, nondistended. Has good bowel sounds. No hepatosplenomegaly appreciated.  EXTREMITIES: No evidence of any cyanosis, clubbing, or peripheral edema. Has +2 pedal and radial pulses bilaterally.   NEUROLOGICAL: The patient is alert, awake, and oriented x3. Moves all extremities spontaneously. No evidence of any acute motor or sensory deficits  appreciated.   SKIN: Moist and warm with no rashes.   LYMPHATIC: There is no cervical or axillary lymphadenopathy.   LABORATORY, DIAGNOSTIC, AND RADIOLOGICAL DATA: Serum glucose 93, BUN 38, creatinine 1.3, sodium 141, potassium 4.7, chloride 98, bicarb 37. LFTs are within normal limits. Albumin of 3.1. Troponin 0.04. White cell count 5.5, hemoglobin 11.8, hematocrit 37.2, platelet count 146. ABG showed a pH of 7.26, pCO2 96, pO2 80, sats 93% on BiPAP.   The patient did have a chest x-ray done which showed bilateral pulmonary hypoinflation and mild prominence of pulmonary vascularity.   ASSESSMENT AND PLAN: This is a 79 year old female with past medical history of obstructive sleep apnea, chronic respiratory failure, history of breast cancer, hypertension, history of CHF, and depression who presents to the hospital due to altered mental status and lethargy and noted to be in acute on chronic respiratory failure.  1. Acute on chronic hypoxic hypercarbic respiratory failure. I suspect this is secondary to her sleep apnea. She really has not been very compliant with her CPAP. She uses it intermittently. Likely this is the reason that she went into respiratory failure. For now I will continue BiPAP support. Follow serial ABGs. There is no evidence of any pneumonia or congestive heart failure that's exacerbating her respiratory failure presently. The patient is well known to Dr. Meredeth Ide. I will go ahead and consult him for now.  2. Altered mental status. I suspect this is secondary to the hypercarbia. No evidence of any acute neurologic source. No evidence of any acute infectious source. Will follow her mental status as her acidosis improves.  3. Hypertension, presently hemodynamically stable. Continue with Benicar and HCTZ.  4. History of breast cancer. Continue with Femara. 5. Hyperlipidemia. Continue simvastatin.  6. Depression. Continue with Effexor.   CODE STATUS: The patient is a FULL CODE.    TIME SPENT WITH THE ADMISSION: 50 minutes.   ____________________________ Rolly Pancake. Cherlynn Kaiser, MD vjs:drc D: 01/29/2012 20:35:36 ET T: 01/30/2012 07:02:54 ET JOB#: [id number]  cc: Rolly Pancake. Cherlynn Kaiser, MD, <Dictator> Margaretann Loveless, MD Houston Siren MD ELECTRONICALLY SIGNED 01/30/2012 14:16
